# Patient Record
Sex: Female | Born: 1990
Health system: Southern US, Community
[De-identification: ages and names within clinical notes are randomized; demographics above are authoritative.]

## PROBLEM LIST (undated history)

## (undated) DIAGNOSIS — F32A Depression, unspecified: Secondary | ICD-10-CM

## (undated) DIAGNOSIS — F329 Major depressive disorder, single episode, unspecified: Secondary | ICD-10-CM

## (undated) DIAGNOSIS — F419 Anxiety disorder, unspecified: Secondary | ICD-10-CM

## (undated) DIAGNOSIS — E559 Vitamin D deficiency, unspecified: Secondary | ICD-10-CM

## (undated) DIAGNOSIS — R634 Abnormal weight loss: Secondary | ICD-10-CM

## (undated) DIAGNOSIS — F988 Other specified behavioral and emotional disorders with onset usually occurring in childhood and adolescence: Secondary | ICD-10-CM

## (undated) HISTORY — PX: TONSILLECTOMY AND ADENOIDECTOMY: SUR1326

## (undated) HISTORY — DX: Abnormal weight loss: R63.4

## (undated) HISTORY — DX: Anxiety disorder, unspecified: F41.9

## (undated) HISTORY — DX: Depression, unspecified: F32.A

## (undated) HISTORY — DX: Vitamin D deficiency, unspecified: E55.9

## (undated) HISTORY — DX: Other specified behavioral and emotional disorders with onset usually occurring in childhood and adolescence: F98.8

## (undated) HISTORY — DX: Major depressive disorder, single episode, unspecified: F32.9

---
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
## 2011-04-01 IMAGING — US US BIOPSY
1 series · 13 of 22 positions shown · non-contrast
Comparison: none

CLINICAL DATA: Palpable right neck mass

[Series 1: us biopsy · 0.06mm/px · 13 of 22 slices shown]
[im 1/22]
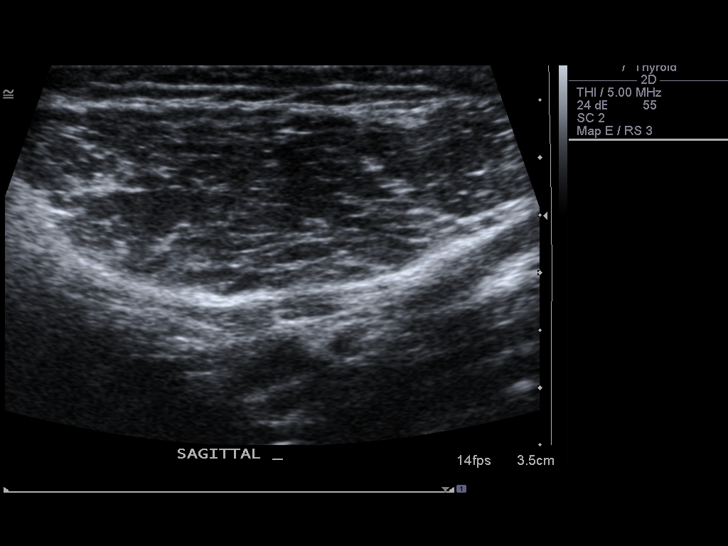
[im 3/22]
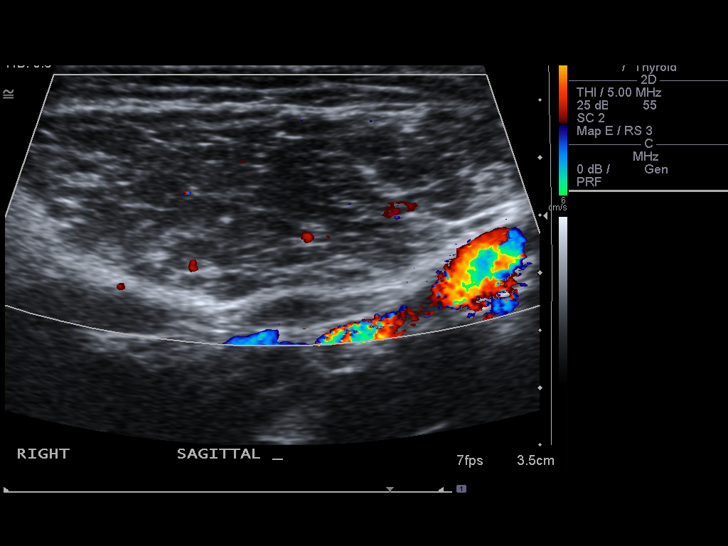
[im 5/22]
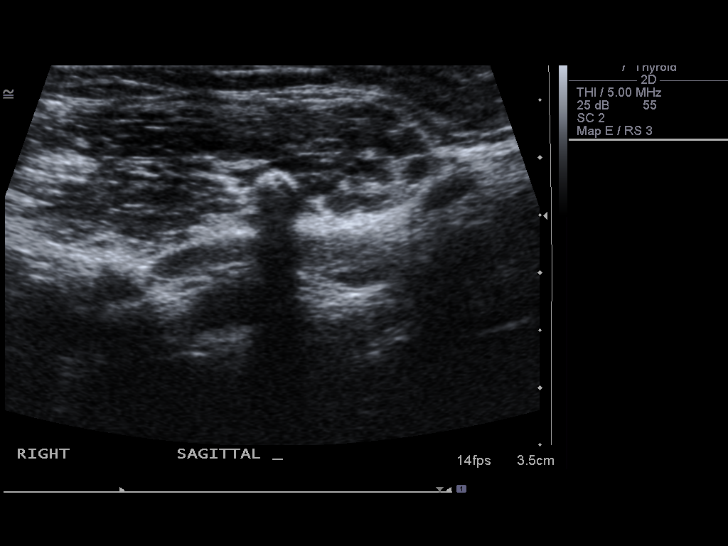
[im 6/22]
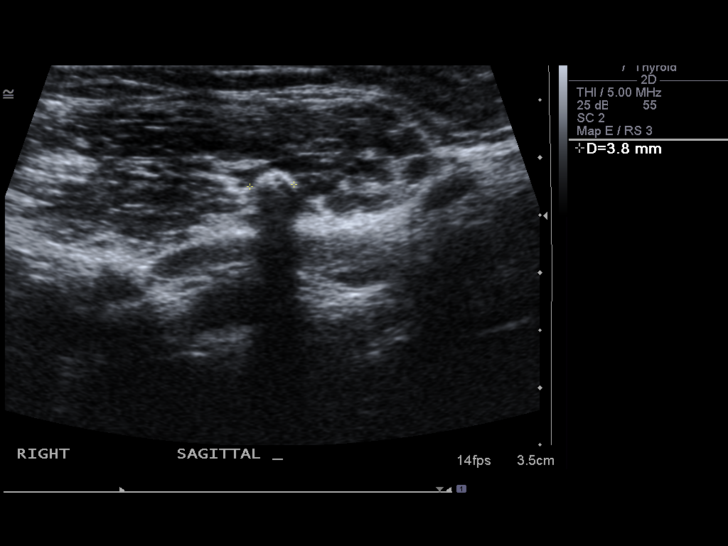
[im 8/22]
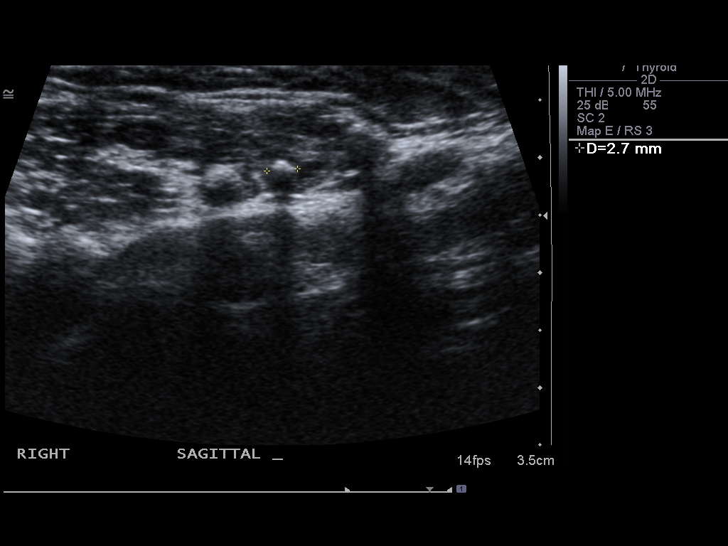
[im 10/22]
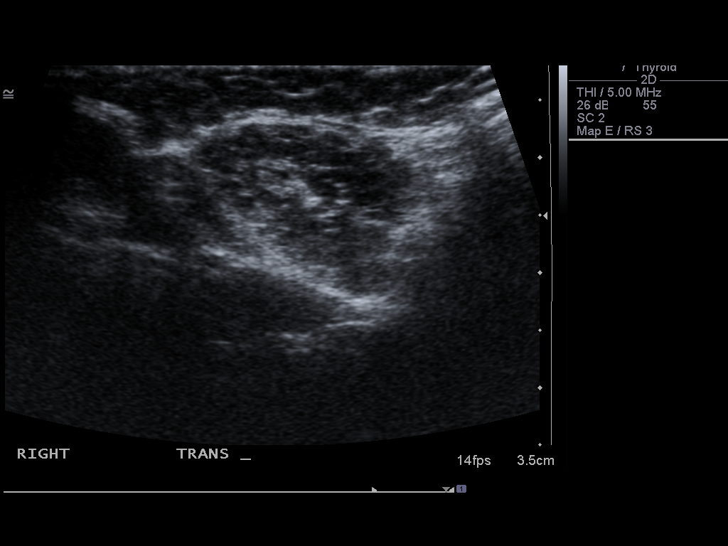
[im 12/22]
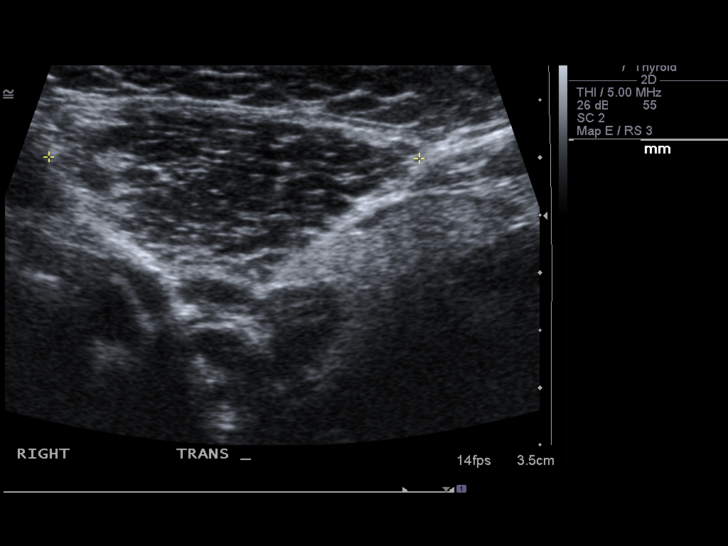
[im 13/22]
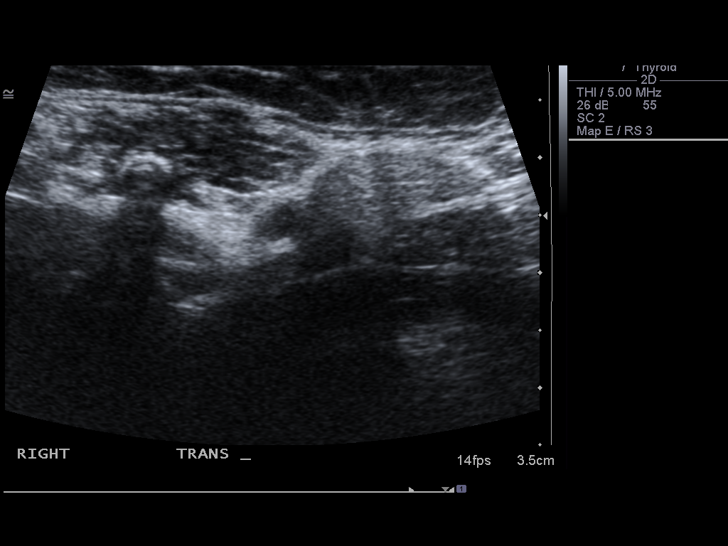
[im 15/22]
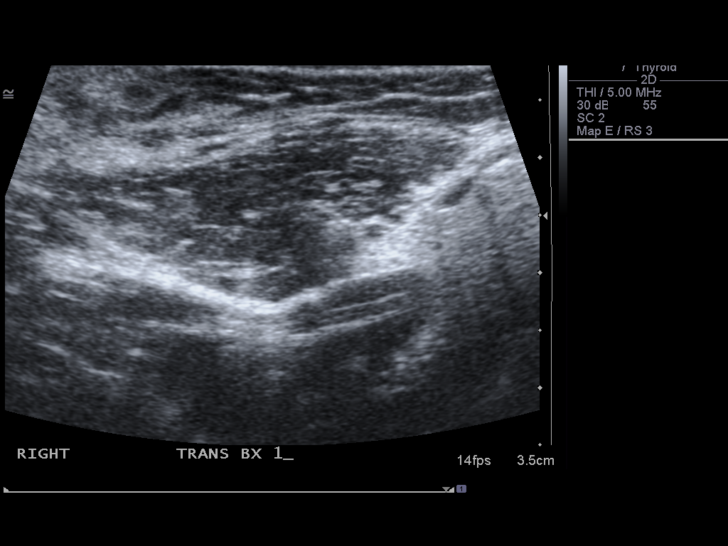
[im 17/22]
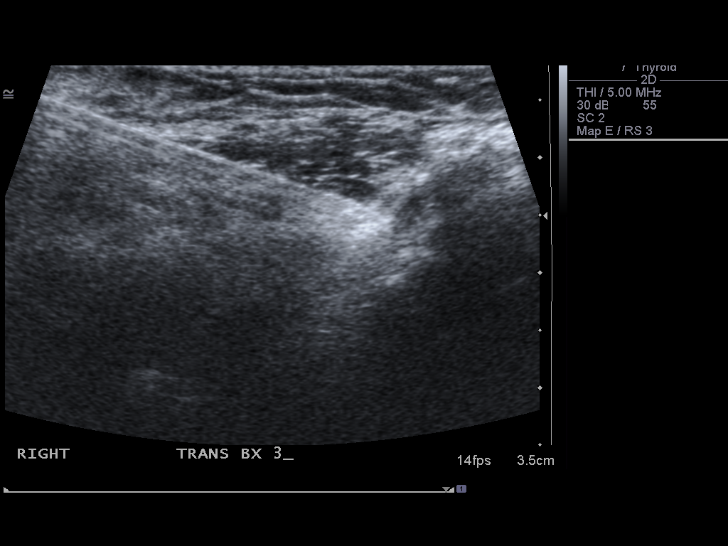
[im 18/22]
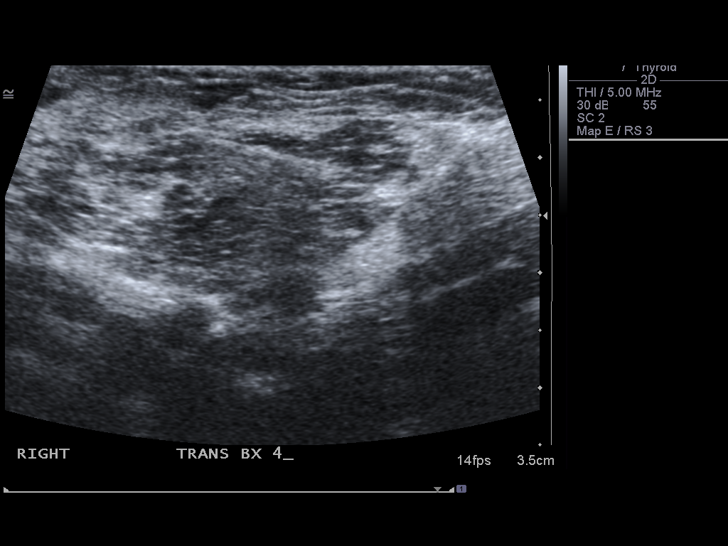
[im 20/22]
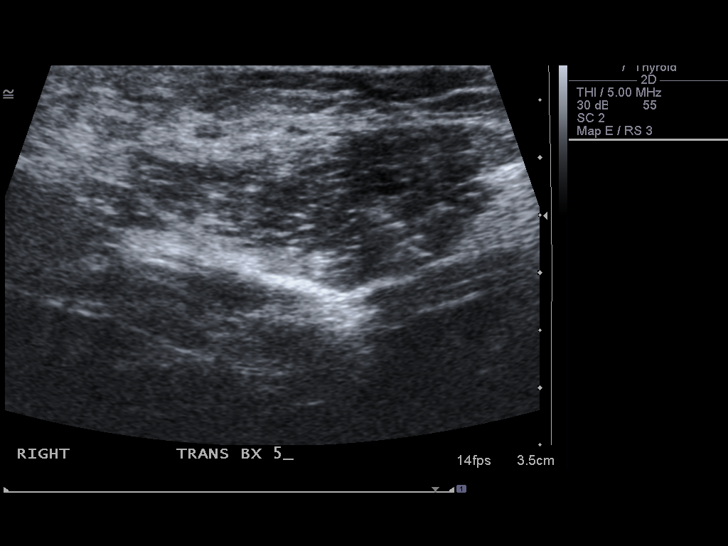
[im 22/22]
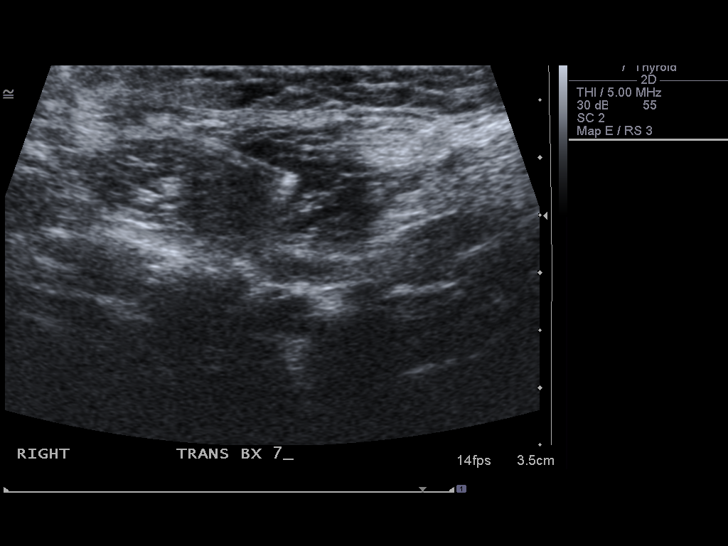

[13 of 22 positions shown; findings below may reference images not displayed]

ULTRASOUND RIGHT NECK MASS FNA AND CORE BIOPSIES

Date:  07/21/2010 [DATE]

Radiologist:  Alia Tiger, M.D.

Medications:  1% lidocaine locally

Guidance:  Ultrasound

Complications:  No immediate

PROCEDURE/FINDINGS:

Informed consent was obtained from the patient following
explanation of the procedure, risks, benefits and alternatives.
The patient understands, agrees and consents for the procedure.
All questions were addressed.  A time out was performed.

Maximal barrier sterile technique utilized including caps, mask,
sterile gowns, sterile gloves, large sterile drape, hand hygiene,
and Chloroprep

Previous CT scan of the neck was reviewed demonstrating a right
neck mass inferior to or originating from the right parotid gland.
Preliminary ultrasound performed of this region.  The lesion is
palpable and located just below the right mandibular angle.  Under
sterile conditions and local anesthesia, initially an 18 gauge core
biopsy needle was advanced into the lesion.  Needle position was
confirmed with ultrasound.  Three core biopsies were attempted
however only small fragments of tissue were obtained.  Additional
attempts were made at core biopsy with a 16 gauge core needle
device also yielding only small fragments of tissue.  No intact
core sample could be obtained.  For additional sampling, FNA biopsy
was performed with a 20 gauge Tiger needle twice.  This also
yielded predominately bloody material.  After these multiple
attempts, additional biopsies were not performed.  Needles removed.
Hemostasis obtained with compression.  No immediate complication.
The patient tolerated the biopsies well.
IMPRESSION: Ultrasound right inferior parotid lesion FNA and core biopsies.

## 2011-08-31 ENCOUNTER — Other Ambulatory Visit: Payer: Self-pay | Admitting: Gastroenterology

## 2011-08-31 DIAGNOSIS — K5090 Crohn's disease, unspecified, without complications: Secondary | ICD-10-CM

## 2011-09-09 ENCOUNTER — Ambulatory Visit
Admission: RE | Admit: 2011-09-09 | Discharge: 2011-09-09 | Disposition: A | Discharge status: Home / Self Care | Payer: Medicaid Other | Source: Ambulatory Visit | Attending: Gastroenterology | Admitting: Gastroenterology

## 2011-09-09 DIAGNOSIS — K5090 Crohn's disease, unspecified, without complications: Secondary | ICD-10-CM

## 2015-12-03 DIAGNOSIS — F900 Attention-deficit hyperactivity disorder, predominantly inattentive type: Secondary | ICD-10-CM | POA: Diagnosis not present

## 2015-12-03 DIAGNOSIS — Z6834 Body mass index (BMI) 34.0-34.9, adult: Secondary | ICD-10-CM | POA: Diagnosis not present

## 2015-12-03 DIAGNOSIS — R5383 Other fatigue: Secondary | ICD-10-CM | POA: Diagnosis not present

## 2016-01-01 DIAGNOSIS — B373 Candidiasis of vulva and vagina: Secondary | ICD-10-CM | POA: Diagnosis not present

## 2016-01-06 DIAGNOSIS — F900 Attention-deficit hyperactivity disorder, predominantly inattentive type: Secondary | ICD-10-CM | POA: Diagnosis not present

## 2016-01-27 DIAGNOSIS — J029 Acute pharyngitis, unspecified: Secondary | ICD-10-CM | POA: Diagnosis not present

## 2016-04-12 DIAGNOSIS — K921 Melena: Secondary | ICD-10-CM | POA: Diagnosis not present

## 2016-04-12 DIAGNOSIS — R634 Abnormal weight loss: Secondary | ICD-10-CM | POA: Diagnosis not present

## 2016-04-12 DIAGNOSIS — F900 Attention-deficit hyperactivity disorder, predominantly inattentive type: Secondary | ICD-10-CM | POA: Diagnosis not present

## 2016-04-12 DIAGNOSIS — E559 Vitamin D deficiency, unspecified: Secondary | ICD-10-CM | POA: Diagnosis not present

## 2016-05-12 ENCOUNTER — Ambulatory Visit (INDEPENDENT_AMBULATORY_CARE_PROVIDER_SITE_OTHER): Payer: 59 | Admitting: Gastroenterology

## 2016-05-12 VITALS — BP 108/80 | HR 92 | Ht 63.75 in | Wt 186.0 lb

## 2016-05-12 DIAGNOSIS — R1032 Left lower quadrant pain: Secondary | ICD-10-CM

## 2016-05-12 DIAGNOSIS — K625 Hemorrhage of anus and rectum: Secondary | ICD-10-CM

## 2016-05-12 DIAGNOSIS — R197 Diarrhea, unspecified: Secondary | ICD-10-CM | POA: Diagnosis not present

## 2016-05-12 NOTE — Progress Notes (Signed)
[facility] Consult Note:  History: Julia Chapman 05/12/2016  Referring physician: Emeterio ReeveWOLTERS,Julia A, MD  Reason for consult/chief complaint: Abdominal Pain; fecal frequency; Rectal Bleeding; and Weight Loss   Subjective HPI:  [name] was referred to me for abdominal pain diarrhea and rectal bleeding. We have limited records from her evaluation with [facility] Madilyn [facility], at which time she was seen for some upper GI symptoms that seem to resolve after a normal UGI S and a trial of some medicines. At this point she is describing 6-9 months of some frequent bandlike lower abdominal pain and frequent rectal bleeding. She is tended toward diarrhea for many years, and says she was evaluated for in high school. She has never had a colonoscopy. Bleeding occurs with most bowel movements and probably began after the birth of her first child about a year ago. She denies antibiotic use or skin rash. Her recent PCP visit he was noted she had lost about 15 pounds in the last 6 months.  ROS:  Review of Systems  Constitutional: Negative for appetite change and unexpected weight change.  HENT: Negative for mouth sores and voice change.   Eyes: Negative for pain and redness.  Respiratory: Negative for cough and shortness of breath.   Cardiovascular: Negative for chest pain and palpitations.  Genitourinary: Negative for dysuria and hematuria.  Musculoskeletal: Negative for myalgias and arthralgias.  Skin: Negative for pallor and rash.  Neurological: Negative for weakness and headaches.  Hematological: Negative for adenopathy.    Positive for anxiety  Past Medical History: Past Medical History  Diagnosis Date  . ADD (attention deficit disorder)   . Depression   . Anxiety   . Weight loss   . Vitamin D deficiency      Past Surgical History: Past Surgical History  Procedure Laterality Date  . Tonsillectomy and adenoidectomy      with collection of tissue removed      Family History: Family History  Problem Relation Age of Onset  . Diabetes Paternal Grandfather     both grandparents of both sides  . Alzheimer's disease Paternal Grandfather   . Breast cancer Maternal Aunt   . Cervical cancer Maternal Aunt   . Ovarian cancer Maternal Grandmother   . Heart attack Maternal Grandfather   . Alcoholism Father   . Alcoholism Maternal Grandfather   . Alcoholism Maternal Uncle     x 2    Social History: Social History   Social History  . Marital Status: Single    Spouse Name: N/A  . Number of Children: 1  . Years of Education: N/A   Occupational History  . phelbotomist [employer]   Social History Main Topics  . Smoking status: Former Smoker -- 0.50 packs/day    Types: Cigarettes    Quit date: 11/29/2013  . Smokeless tobacco: Never Used  . Alcohol Use: 0.0 oz/week    0 Standard drinks or equivalent per week     Comment: glass of red wine on night off  . Drug Use: Yes     Comment: marijuana  . Sexual Activity: Not Asked   Other Topics Concern  . None   Social History Narrative  . None    Allergies: No Known Allergies  Outpatient Meds: Current Outpatient Prescriptions  Medication Sig Dispense Refill  . buPROPion (WELLBUTRIN SR) 200 MG 12 hr tablet Take 1 tablet by mouth 2 (two) times daily.    Marland Kitchen. lisdexamfetamine (VYVANSE) 20 MG capsule  Take 1 capsule by mouth as needed.     No current facility-administered medications for this visit.      ___________________________________________________________________ Objective  Exam:  BP 108/80 mmHg  Pulse 92  Ht 5' 3.75" (1.619 m)  Wt 186 lb (84.369 kg)  BMI 32.19 kg/m2  LMP 05/07/2016   General: this is a(n) Well-appearing young woman, no muscle wasting   Eyes: sclera anicteric, no redness  ENT: oral mucosa moist without lesions, no cervical or supraclavicular lymphadenopathy, good dentition  CV: RRR without murmur, S1/S2, no JVD, no peripheral  edema  Resp: clear to auscultation bilaterally, normal RR and effort noted  GI: soft, mild LLQ tenderness, with active bowel sounds. No guarding or palpable organomegaly noted.  Skin; warm and dry, no rash or jaundice noted  Neuro: awake, alert and oriented x 3. Normal gross motor function and fluent speech  Labs:  Recent normal CBC and CMP *  Assessment: Encounter Diagnoses  Name Primary?  . Diarrhea, unspecified type Yes  . Rectal bleeding   . 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  Plan:  Colonoscopy  The benefits and risks of the planned procedure were described in detail with the patient or (when appropriate) their health care proxy.  Risks were outlined as including, but not limited to, bleeding, infection, perforation, adverse medication reaction leading to cardiac or pulmonary decompensation, or pancreatitis (if ERCP).  The limitation of incomplete mucosal visualization was also discussed.  No guarantees or warranties were given.    Thank you for the courtesy of this consult.  Please call me with any questions or concerns.  Julia Chapman  CC: Julia Reeve, MD

## 2016-05-12 NOTE — Patient Instructions (Signed)
If you are age 24 or older, your body mass index should be between 23-30. Your Body mass index is 32.19 kg/(m^2). If this is out of the aforementioned range listed, please consider follow up with your Primary Care Provider.  If you are age 24 or younger, your body mass index should be between 19-25. Your Body mass index is 32.19 kg/(m^2). If this is out of the aformentioned range listed, please consider follow up with your Primary Care Provider.   You have been scheduled for a colonoscopy. Please follow written instructions given to you at your visit today.  Please pick up your prep supplies at the pharmacy within the next 1-3 days. If you use inhalers (even only as needed), please bring them with you on the day of your procedure. Your physician has requested that you go to www.startemmi.com and enter the access code given to you at your visit today. This web site gives a general overview about your procedure. However, you should still follow specific instructions given to you by our office regarding your preparation for the procedure.  Thank you for choosing [facility]  Dr Amada JupiterHenry Danis III

## 2016-05-25 DIAGNOSIS — Z113 Encounter for screening for infections with a predominantly sexual mode of transmission: Secondary | ICD-10-CM | POA: Diagnosis not present

## 2016-05-25 DIAGNOSIS — N898 Other specified noninflammatory disorders of vagina: Secondary | ICD-10-CM | POA: Diagnosis not present

## 2016-05-25 DIAGNOSIS — N920 Excessive and frequent menstruation with regular cycle: Secondary | ICD-10-CM | POA: Diagnosis not present

## 2016-05-25 DIAGNOSIS — Z124 Encounter for screening for malignant neoplasm of cervix: Secondary | ICD-10-CM | POA: Diagnosis not present

## 2016-05-25 DIAGNOSIS — Z01419 Encounter for gynecological examination (general) (routine) without abnormal findings: Secondary | ICD-10-CM | POA: Diagnosis not present

## 2016-06-03 ENCOUNTER — Encounter: Payer: Self-pay | Admitting: Gastroenterology

## 2016-06-03 ENCOUNTER — Ambulatory Visit (AMBULATORY_SURGERY_CENTER): Payer: 59 | Admitting: Gastroenterology

## 2016-06-03 VITALS — BP 118/77 | HR 69 | Temp 96.8°F | Resp 13 | Ht 63.0 in | Wt 186.0 lb

## 2016-06-03 DIAGNOSIS — F329 Major depressive disorder, single episode, unspecified: Secondary | ICD-10-CM | POA: Diagnosis not present

## 2016-06-03 DIAGNOSIS — K921 Melena: Secondary | ICD-10-CM | POA: Diagnosis not present

## 2016-06-03 DIAGNOSIS — R197 Diarrhea, unspecified: Secondary | ICD-10-CM | POA: Diagnosis present

## 2016-06-03 DIAGNOSIS — K625 Hemorrhage of anus and rectum: Secondary | ICD-10-CM | POA: Diagnosis not present

## 2016-06-03 MED ORDER — SODIUM CHLORIDE 0.9 % IV SOLN: 500.0000 mL | INTRAVENOUS | Status: DC

## 2016-06-03 MED ORDER — HYOSCYAMINE SULFATE 0.125 MG SL SUBL: 0.1250 mg | SUBLINGUAL_TABLET | SUBLINGUAL | Status: AC | PRN

## 2016-06-03 NOTE — Op Note (Signed)
[facility] Patient Name: Julia Chapman Procedure Date: 06/03/2016 9:19 AM MRN: [id number] Endoscopist: Sherilyn CooterHenry L. Myrtie Chapman , MD Age: [age] Referring MD:  Date of Birth: 1991-08-15 Gender: Female Account #: [id number] Procedure:                Colonoscopy Indications:              Lower abdominal pain, Chronic diarrhea, Rectal                            bleeding Medicines:                Monitored Anesthesia Care Procedure:                Pre-Anesthesia Assessment:                           - Prior to the procedure, a History and Physical                            was performed, and patient medications and                            allergies were reviewed. The patient's tolerance of                            previous anesthesia was also reviewed. The risks                            and benefits of the procedure and the sedation                            options and risks were discussed with the patient.                            All questions were answered, and informed consent                            was obtained. Prior Anticoagulants: The patient has                            taken no previous anticoagulant or antiplatelet                            agents. ASA Grade Assessment: II - A patient with                            mild systemic disease. After reviewing the risks                            and benefits, the patient was deemed in                            satisfactory condition to undergo the procedure.  After obtaining informed consent, the colonoscope                            was passed under direct vision. Throughout the                            procedure, the patient's blood pressure, pulse, and                            oxygen saturations were monitored continuously. The                            Model CF-HQ190L [id number]) scope was introduced                            through the anus and advanced to the the terminal                             ileum. The colonoscopy was performed without                            difficulty. The patient tolerated the procedure                            well. The quality of the bowel preparation was                            excellent. The terminal ileum, ileocecal valve,                            appendiceal orifice, and rectum were photographed.                            The bowel preparation used was SUPREP. Scope In: 9:29:53 AM Scope Out: 9:39:22 AM Scope Withdrawal Time: 0 hours 6 minutes 59 seconds  Total Procedure Duration: 0 hours 9 minutes 29 seconds  Findings:                 The perianal and digital rectal examinations were                            normal.                           The terminal ileum appeared normal.                           The entire examined colon appeared normal on direct                            and retroflexion views. Complications:            No immediate complications. Estimated Blood Loss:     Estimated blood loss: none. Impression:               - The examined portion of the ileum  was normal.                           - The entire examined colon is normal on direct and                            retroflexion views.                           - No specimens collected.                           - The overall clinical scenario is most consistent                            with IBS and benign anal bleeding. Recommendation:           - Patient has a contact number available for                            emergencies. The signs and symptoms of potential                            delayed complications were discussed with the                            patient. Return to normal activities tomorrow.                            Written discharge instructions were provided to the                            patient.                           - Resume previous diet.                           - Continue present medications.                            - No recommendation at this time regarding repeat                            colonoscopy due to age.                           - Use Levsin, NuLev (hyoscyamine) 0.125 mg 1-2 tabs                            SL q 4 hours. Julia Mcgillivray L. Myrtie Neitheranis, MD 06/03/2016 9:45:01 AM This report has been signed electronically.

## 2016-06-03 NOTE — Progress Notes (Signed)
Patient awakening,vss,report to rn 

## 2016-06-03 NOTE — Patient Instructions (Addendum)
YOU HAD AN ENDOSCOPIC PROCEDURE TODAY AT THE [facility]:   Refer to the procedure report that was given to you for any specific questions about what was found during the examination.  If the procedure report does not answer your questions, please call your gastroenterologist to clarify.  If you requested that your care partner not be given the details of your procedure findings, then the procedure report has been included in a sealed envelope for you to review at your convenience later.  YOU SHOULD EXPECT: Some feelings of bloating in the abdomen. Passage of more gas than usual.  Walking can help get rid of the air that was put into your GI tract during the procedure and reduce the bloating. If you had a lower endoscopy (such as a colonoscopy or flexible sigmoidoscopy) you may notice spotting of blood in your stool or on the toilet paper. If you underwent a bowel prep for your procedure, you may not have a normal bowel movement for a few days.  Please Note:  You might notice some irritation and congestion in your nose or some drainage.  This is from the oxygen used during your procedure.  There is no need for concern and it should clear up in a day or so.  SYMPTOMS TO REPORT IMMEDIATELY:   Following lower endoscopy (colonoscopy or flexible sigmoidoscopy):  Excessive amounts of blood in the stool  Significant tenderness or worsening of abdominal pains  Swelling of the abdomen that is new, acute  Fever of 100F or higher   For urgent or emergent issues, a gastroenterologist can be reached at any hour by calling [phone].   DIET: Your first meal following the procedure should be a small meal and then it is ok to progress to your normal diet. Heavy or fried foods are harder to digest and may make you feel nauseous or bloated.  Likewise, meals heavy in dairy and vegetables can increase bloating.  Drink plenty of fluids but you should avoid alcoholic beverages for 24  hours.  ACTIVITY:  You should plan to take it easy for the rest of today and you should NOT DRIVE or use heavy machinery until tomorrow (because of the sedation medicines used during the test).    FOLLOW UP: Our staff will call the number listed on your records the next business day following your procedure to check on you and address any questions or concerns that you may have regarding the information given to you following your procedure. If we do not reach you, we will leave a message.  However, if you are feeling well and you are not experiencing any problems, there is no need to return our call.  We will assume that you have returned to your regular daily activities without incident.  If any biopsies were taken you will be contacted by phone or by letter within the next 1-3 weeks.  Please call us at [phone] if you have not heard about the biopsies in 3 weeks.    SIGNATURES/CONFIDENTIALITY: You and/or your care partner have signed paperwork which will be entered into your electronic medical record.  These signatures attest to the fact that that the information above on your After Visit Summary has been reviewed and is understood.  Full responsibility of the confidentiality of this discharge information lies with you and/or your care-partner.   USE LEVSIN 0.125 MG SUBLINGUAL EVERY 4 HRS AS NEEDED -THIS MEDICATION ORDER WAS SENT TO [facility] [address] BY  DR. Myrtie NeitherANIS FOR YOU TO PICK UP  CONTINUE PREVIOUS MEDICATIONS AND DIET    Food Guidelines for a sensitive stomach  Many people have difficulty digesting certain foods, causing a variety of distressing and embarrassing symptoms such as abdominal pain, bloating and gas.  These foods may need to be avoided or consumed in small amounts.  Here are some tips that might be helpful for you.  1.   Lactose intolerance is the difficulty or complete inability to digest lactose, the natural sugar in milk and anything made from milk.  This  condition is harmless, common, and can begin any time during life.  Some people can digest a modest amount of lactose while others cannot tolerate any.  Also, not all dairy products contain equal amounts of lactose.  For example, hard cheeses such as parmesan have less lactose than soft cheeses such as cheddar.  Yogurt has less lactose than milk or cheese.  Many packaged foods (even many brands of bread) have milk, so read ingredient lists carefully.  It is difficult to test for lactose intolerance, so just try avoiding lactose as much as possible for a week and see what happens with your symptoms.  If you seem to be lactose intolerant, the best plan is to avoid it (but make sure you get calcium from another source).  The next best thing is to use lactase enzyme supplements, available over the counter everywhere.  Just know that many lactose intolerant people need to take several tablets with each serving of dairy to avoid symptoms.  Lastly, a lot of restaurant food is made with milk or butter.  Many are things you might not suspect, such as mashed potatoes, rice and pasta (cooked with butter) and "grilled" items.  If you are lactose intolerant, it never hurts to ask your server what has milk or butter.  2.   Fiber is an important part of your diet, but not all fiber is well-tolerated.  Insoluble fiber such as bran is often consumed by normal gut bacteria and converted into gas.  Soluble fiber such as oats, squash, carrots and green beans are typically tolerated better.  3.   Some types of carbohydrates can be poorly digested.  Examples include: fructose (apples, cherries, pears, raisins and other dried fruits), fructans (onions, zucchini, large amounts of wheat), sorbitol/mannitol/xylitol and sucralose/Splenda (common artificial sweeteners), and raffinose (lentils, broccoli, cabbage, asparagus, brussel sprouts, many types of beans).  Do a Programmer, multimediaweb search for National City[place]ODMAP diet and you will find helpful  information. Beano, a dietary supplement, will often help with raffinose-containing foods.  As with lactase tablets, you may need several per serving.  4.   Whenever possible, avoid processed food&meats and chemical additives.  High fructose corn syrup, a common sweetener, may be difficult to digest.  Eggs and soy (comes from the soybean, and added to many foods now) are the other most common bloating/gassy foods.  - Dr. Sherlynn CarbonHenry Danis [facility]

## 2016-06-04 ENCOUNTER — Telehealth: Payer: Self-pay

## 2016-06-04 NOTE — Telephone Encounter (Signed)
Unable to leave message mailbox full.

## 2016-06-07 DIAGNOSIS — K921 Melena: Secondary | ICD-10-CM | POA: Diagnosis not present

## 2016-06-07 DIAGNOSIS — F900 Attention-deficit hyperactivity disorder, predominantly inattentive type: Secondary | ICD-10-CM | POA: Diagnosis not present

## 2016-07-15 DIAGNOSIS — E559 Vitamin D deficiency, unspecified: Secondary | ICD-10-CM | POA: Diagnosis not present

## 2016-07-22 MED FILL — BUPROPION HCL SR 200 MG TAB: 200 | 30 days supply | Qty: 60 | Fill #0

## 2016-08-09 MED FILL — VYVANSE 40 MG CAPSULE: 40 | 30 days supply | Qty: 30 | Fill #0

## 2016-09-06 MED FILL — BUPROPION HCL SR 200 MG TAB: 200 | 30 days supply | Qty: 60 | Fill #1

## 2016-09-13 MED FILL — VYVANSE 40 MG CAPSULE: 40 | 30 days supply | Qty: 30 | Fill #0

## 2016-10-18 MED FILL — VYVANSE 40 MG CAPSULE: 40 | 30 days supply | Qty: 30 | Fill #0

## 2016-10-19 MED FILL — BUPROPION HCL SR 200 MG TAB: 200 | 30 days supply | Qty: 60 | Fill #2

## 2016-11-19 MED FILL — VYVANSE 40 MG CAPSULE: 40 | 30 days supply | Qty: 30 | Fill #0

## 2016-12-02 DIAGNOSIS — F329 Major depressive disorder, single episode, unspecified: Secondary | ICD-10-CM | POA: Diagnosis not present

## 2016-12-02 DIAGNOSIS — K589 Irritable bowel syndrome without diarrhea: Secondary | ICD-10-CM | POA: Diagnosis not present

## 2016-12-02 DIAGNOSIS — F900 Attention-deficit hyperactivity disorder, predominantly inattentive type: Secondary | ICD-10-CM | POA: Diagnosis not present

## 2016-12-02 MED FILL — BUPROPION HCL SR 200 MG TAB: 200 | 90 days supply | Qty: 180 | Fill #0

## 2016-12-24 MED FILL — VYVANSE 40 MG CAPSULE: 40 | 30 days supply | Qty: 30 | Fill #0

## 2017-02-07 MED FILL — VYVANSE 40 MG CAPSULE: 40 | 30 days supply | Qty: 30 | Fill #0

## 2017-03-10 DIAGNOSIS — O26899 Other specified pregnancy related conditions, unspecified trimester: Secondary | ICD-10-CM | POA: Diagnosis not present

## 2017-03-10 DIAGNOSIS — Z8759 Personal history of other complications of pregnancy, childbirth and the puerperium: Secondary | ICD-10-CM | POA: Diagnosis not present

## 2017-03-10 DIAGNOSIS — R102 Pelvic and perineal pain: Secondary | ICD-10-CM | POA: Diagnosis not present

## 2017-03-10 DIAGNOSIS — O09291 Supervision of pregnancy with other poor reproductive or obstetric history, first trimester: Secondary | ICD-10-CM | POA: Diagnosis not present

## 2017-03-16 DIAGNOSIS — R7989 Other specified abnormal findings of blood chemistry: Secondary | ICD-10-CM | POA: Diagnosis not present

## 2017-03-16 DIAGNOSIS — Z3A08 8 weeks gestation of pregnancy: Secondary | ICD-10-CM | POA: Diagnosis not present

## 2017-03-16 DIAGNOSIS — N9489 Other specified conditions associated with female genital organs and menstrual cycle: Secondary | ICD-10-CM | POA: Diagnosis not present

## 2017-04-07 DIAGNOSIS — Z3A11 11 weeks gestation of pregnancy: Secondary | ICD-10-CM | POA: Diagnosis not present

## 2017-04-07 DIAGNOSIS — Z113 Encounter for screening for infections with a predominantly sexual mode of transmission: Secondary | ICD-10-CM | POA: Diagnosis not present

## 2017-04-07 DIAGNOSIS — Z369 Encounter for antenatal screening, unspecified: Secondary | ICD-10-CM | POA: Diagnosis not present

## 2017-04-07 DIAGNOSIS — Z3482 Encounter for supervision of other normal pregnancy, second trimester: Secondary | ICD-10-CM | POA: Diagnosis not present

## 2017-04-07 DIAGNOSIS — R7989 Other specified abnormal findings of blood chemistry: Secondary | ICD-10-CM | POA: Diagnosis not present

## 2017-04-07 DIAGNOSIS — Z3481 Encounter for supervision of other normal pregnancy, first trimester: Secondary | ICD-10-CM | POA: Diagnosis not present

## 2017-04-07 DIAGNOSIS — Z6828 Body mass index (BMI) 28.0-28.9, adult: Secondary | ICD-10-CM | POA: Diagnosis not present

## 2017-04-11 MED FILL — BD NEEDLES 22GX1.5": 22G X 1-1/2 | 70 days supply | Qty: 20 | Fill #0

## 2017-04-11 MED FILL — BD NEEDLES 22GX1.5: 22G X 1-1/2 | 70 days supply | Qty: 20 | Fill #0

## 2017-04-11 MED FILL — BD SYRINGE 3 ML: 3 ML | 70 days supply | Qty: 20 | Fill #0

## 2017-04-11 MED FILL — PROGESTERONE OIL 50 MG/ML V: 50 | 26 days supply | Qty: 30 | Fill #0

## 2017-05-09 DIAGNOSIS — Z3482 Encounter for supervision of other normal pregnancy, second trimester: Secondary | ICD-10-CM | POA: Diagnosis not present

## 2017-05-09 DIAGNOSIS — R7989 Other specified abnormal findings of blood chemistry: Secondary | ICD-10-CM | POA: Diagnosis not present

## 2017-05-09 DIAGNOSIS — Z3A16 16 weeks gestation of pregnancy: Secondary | ICD-10-CM | POA: Diagnosis not present

## 2017-05-23 MED FILL — PROGESTERONE 200 MG CAPSULE: 200 | 30 days supply | Qty: 30 | Fill #0

## 2017-05-23 MED FILL — PROGESTERONE OIL 50 MG/ML V: 50 | 26 days supply | Qty: 30 | Fill #0

## 2017-05-30 DIAGNOSIS — R7989 Other specified abnormal findings of blood chemistry: Secondary | ICD-10-CM | POA: Diagnosis not present

## 2017-05-30 DIAGNOSIS — Z3A19 19 weeks gestation of pregnancy: Secondary | ICD-10-CM | POA: Diagnosis not present

## 2017-05-30 DIAGNOSIS — Z3689 Encounter for other specified antenatal screening: Secondary | ICD-10-CM | POA: Diagnosis not present

## 2017-05-30 MED FILL — BUPROPION HCL SR 200 MG TAB: 200 | 30 days supply | Qty: 60 | Fill #0

## 2017-06-09 MED FILL — BD NEEDLES 22GX1.5": 22G X 1-1/2 | 20 days supply | Qty: 20 | Fill #0

## 2017-06-09 MED FILL — BD SYRINGE 3 ML: 3 ML | 20 days supply | Qty: 20 | Fill #0

## 2017-06-09 MED FILL — BD NEEDLES 22GX1.5: 22G X 1-1/2 | 20 days supply | Qty: 20 | Fill #0

## 2017-07-04 DIAGNOSIS — R7989 Other specified abnormal findings of blood chemistry: Secondary | ICD-10-CM | POA: Diagnosis not present

## 2017-07-04 DIAGNOSIS — Z113 Encounter for screening for infections with a predominantly sexual mode of transmission: Secondary | ICD-10-CM | POA: Diagnosis not present

## 2017-07-04 DIAGNOSIS — Z369 Encounter for antenatal screening, unspecified: Secondary | ICD-10-CM | POA: Diagnosis not present

## 2017-07-11 MED FILL — BUPROPION HCL SR 200 MG TAB: 200 | 30 days supply | Qty: 60 | Fill #1

## 2017-07-11 MED FILL — PROGESTERONE OIL 50 MG/ML V: 50 | 25 days supply | Qty: 30 | Fill #0

## 2017-08-03 DIAGNOSIS — Z23 Encounter for immunization: Secondary | ICD-10-CM | POA: Diagnosis not present

## 2017-08-03 DIAGNOSIS — Z3A28 28 weeks gestation of pregnancy: Secondary | ICD-10-CM | POA: Diagnosis not present

## 2017-08-03 DIAGNOSIS — Z3483 Encounter for supervision of other normal pregnancy, third trimester: Secondary | ICD-10-CM | POA: Diagnosis not present

## 2017-08-03 DIAGNOSIS — R7989 Other specified abnormal findings of blood chemistry: Secondary | ICD-10-CM | POA: Diagnosis not present

## 2017-08-10 MED FILL — BD SYRINGE 3 ML: 3 ML | 20 days supply | Qty: 20 | Fill #0

## 2017-08-10 MED FILL — BD NEEDLES 22GX1.5: 22G X 1-1/2 | 20 days supply | Qty: 20 | Fill #0

## 2017-08-10 MED FILL — BD NEEDLES 22GX1.5": 22G X 1-1/2 | 20 days supply | Qty: 20 | Fill #0

## 2017-08-15 MED FILL — BUPROPION HCL SR 200 MG TAB: 200 | 30 days supply | Qty: 60 | Fill #2

## 2017-09-02 DIAGNOSIS — Z3A32 32 weeks gestation of pregnancy: Secondary | ICD-10-CM | POA: Diagnosis not present

## 2017-09-02 DIAGNOSIS — R7989 Other specified abnormal findings of blood chemistry: Secondary | ICD-10-CM | POA: Diagnosis not present

## 2017-09-02 DIAGNOSIS — Z23 Encounter for immunization: Secondary | ICD-10-CM | POA: Diagnosis not present

## 2017-09-02 DIAGNOSIS — O09299 Supervision of pregnancy with other poor reproductive or obstetric history, unspecified trimester: Secondary | ICD-10-CM | POA: Diagnosis not present

## 2017-09-16 MED FILL — BUPROPION HCL SR 200 MG TAB: 200 | 30 days supply | Qty: 60 | Fill #3

## 2017-09-27 DIAGNOSIS — Z3685 Encounter for antenatal screening for Streptococcus B: Secondary | ICD-10-CM | POA: Diagnosis not present

## 2017-09-27 DIAGNOSIS — Z3483 Encounter for supervision of other normal pregnancy, third trimester: Secondary | ICD-10-CM | POA: Diagnosis not present

## 2017-09-27 DIAGNOSIS — Z113 Encounter for screening for infections with a predominantly sexual mode of transmission: Secondary | ICD-10-CM | POA: Diagnosis not present

## 2017-10-12 DIAGNOSIS — Z3A38 38 weeks gestation of pregnancy: Secondary | ICD-10-CM | POA: Diagnosis not present

## 2017-10-12 DIAGNOSIS — Z87891 Personal history of nicotine dependence: Secondary | ICD-10-CM | POA: Diagnosis not present

## 2017-10-12 DIAGNOSIS — O99824 Streptococcus B carrier state complicating childbirth: Secondary | ICD-10-CM | POA: Diagnosis not present

## 2017-10-14 MED FILL — IBUPROFEN 800 MG TAB: 800 | 30 days supply | Qty: 30 | Fill #0

## 2017-10-14 MED FILL — FERROUS SULFATE 325 MG TAB: 325 (65 FE) | 100 days supply | Qty: 100 | Fill #0

## 2017-10-19 MED FILL — BUPROPION HCL SR 200 MG TAB: 200 | 30 days supply | Qty: 60 | Fill #0

## 2017-11-21 MED FILL — BUPROPION HCL SR 200 MG TAB: 200 | 30 days supply | Qty: 60 | Fill #0

## 2017-12-22 MED FILL — BUPROPION HCL SR 200 MG TAB: 200 | 30 days supply | Qty: 60 | Fill #0 | Status: TO

## 2019-08-28 ENCOUNTER — Other Ambulatory Visit (HOSPITAL_COMMUNITY)
Admission: RE | Admit: 2019-08-28 | Discharge: 2019-08-28 | Disposition: A | Discharge status: Home / Self Care | Payer: Medicaid Other | Source: Ambulatory Visit | Attending: Family Medicine | Admitting: Family Medicine

## 2019-08-28 DIAGNOSIS — Z01411 Encounter for gynecological examination (general) (routine) with abnormal findings: Secondary | ICD-10-CM | POA: Diagnosis present

## 2019-12-29 ENCOUNTER — Encounter (HOSPITAL_COMMUNITY): Payer: Self-pay

## 2019-12-29 ENCOUNTER — Ambulatory Visit (HOSPITAL_COMMUNITY)
Admission: EM | Admit: 2019-12-29 | Discharge: 2019-12-29 | Disposition: A | Discharge status: Home / Self Care | Payer: 59

## 2019-12-29 DIAGNOSIS — S0591XA Unspecified injury of right eye and orbit, initial encounter: Secondary | ICD-10-CM

## 2019-12-29 DIAGNOSIS — H5789 Other specified disorders of eye and adnexa: Secondary | ICD-10-CM | POA: Diagnosis not present

## 2019-12-29 MED ORDER — FLUORESCEIN SODIUM 1 MG OP STRP
ORAL_STRIP | OPHTHALMIC | Status: AC
  Filled 2019-12-29: qty 1

## 2019-12-29 MED ORDER — TOBRAMYCIN 0.3 % OP SOLN: 1.0000 [drp] | OPHTHALMIC | 0 refills | Status: AC

## 2019-12-29 MED ORDER — TETRACAINE HCL 0.5 % OP SOLN
OPHTHALMIC | Status: AC
  Filled 2019-12-29: qty 4
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
## 2019-12-29 NOTE — Discharge Instructions (Signed)
Make sure you follow up with your eye doctor first thing Monday morning. If you develop worsening symptoms like vision loss, eye swelling, drainage from your eye, then please report to the ER.
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
## 2020-11-23 ENCOUNTER — Encounter (HOSPITAL_BASED_OUTPATIENT_CLINIC_OR_DEPARTMENT_OTHER): Payer: Self-pay | Admitting: Emergency Medicine

## 2020-11-23 ENCOUNTER — Emergency Department (HOSPITAL_BASED_OUTPATIENT_CLINIC_OR_DEPARTMENT_OTHER)
Admission: EM | Admit: 2020-11-23 | Discharge: 2020-11-23 | Disposition: A | Discharge status: Home / Self Care | Payer: 59 | Attending: Emergency Medicine | Admitting: Emergency Medicine

## 2020-11-23 ENCOUNTER — Other Ambulatory Visit: Payer: Self-pay

## 2020-11-23 DIAGNOSIS — Z202 Contact with and (suspected) exposure to infections with a predominantly sexual mode of transmission: Secondary | ICD-10-CM | POA: Diagnosis not present

## 2020-11-23 DIAGNOSIS — Z711 Person with feared health complaint in whom no diagnosis is made: Secondary | ICD-10-CM

## 2020-11-23 DIAGNOSIS — N898 Other specified noninflammatory disorders of vagina: Secondary | ICD-10-CM | POA: Diagnosis present

## 2020-11-23 DIAGNOSIS — Z87891 Personal history of nicotine dependence: Secondary | ICD-10-CM | POA: Diagnosis not present

## 2020-11-23 LAB — WET PREP, GENITAL
Sperm: NONE SEEN
Trich, Wet Prep: NONE SEEN
Yeast Wet Prep HPF POC: NONE SEEN

## 2020-11-23 LAB — URINALYSIS, ROUTINE W REFLEX MICROSCOPIC
Bilirubin Urine: NEGATIVE
Glucose, UA: NEGATIVE mg/dL
Hgb urine dipstick: NEGATIVE
Ketones, ur: NEGATIVE mg/dL
Leukocytes,Ua: NEGATIVE
Nitrite: NEGATIVE
Protein, ur: NEGATIVE mg/dL
Specific Gravity, Urine: 1.005 (ref 1.005–1.030)
pH: 6 (ref 5.0–8.0)

## 2020-11-23 LAB — PREGNANCY, URINE: Preg Test, Ur: NEGATIVE

## 2020-11-23 MED ORDER — DOXYCYCLINE HYCLATE 100 MG PO TABS
100.0000 mg | ORAL_TABLET | Freq: Once | ORAL | Status: AC
  Administered 2020-11-23: 100 mg via ORAL
  Filled 2020-11-23: qty 1

## 2020-11-23 MED ORDER — LIDOCAINE HCL (PF) 1 % IJ SOLN
INTRAMUSCULAR | Status: AC
  Administered 2020-11-23: 1 mL
  Filled 2020-11-23: qty 5

## 2020-11-23 MED ORDER — CEFTRIAXONE SODIUM 500 MG IJ SOLR
500.0000 mg | Freq: Once | INTRAMUSCULAR | Status: AC
  Administered 2020-11-23: 500 mg via INTRAMUSCULAR
  Filled 2020-11-23: qty 500

## 2020-11-23 MED ORDER — DOXYCYCLINE HYCLATE 100 MG PO CAPS: 100.0000 mg | ORAL_CAPSULE | Freq: Two times a day (BID) | ORAL | 0 refills | Status: AC

## 2020-11-23 NOTE — ED Triage Notes (Signed)
Reports having discharge with tingling for the last two days.

## 2020-11-23 NOTE — ED Notes (Signed)
Pt self swabbed per EDP  

## 2020-11-23 NOTE — Discharge Instructions (Addendum)
You were seen today with concerns for vaginal discharge and exposure to possible STDs.  You were tested and treated.  Abstain from sexual activity for the next 10 days.  Take antibiotics as directed

## 2020-11-23 NOTE — ED Provider Notes (Signed)
[facility] EMERGENCY DEPARTMENT Provider Note   CSN: [id number] Arrival date & time: 11/23/20  0118     History Chief Complaint  Patient presents with  . Vaginal Discharge    Julia Chapman is a 29 y.o. female.  HPI     This is a 29 year old female with a history of ADD and anxiety who presents with vaginal discharge.  Patient reports she had sex with a new partner several days ago.  It was unprotected.  She reports that she had onset of vaginal discharge that was yellow in nature.  She also reports some dysuria and blood when she wipes.  No abdominal pain.  Last menstrual period was approximately 2 weeks ago.  She is not on any birth control.  She is concerned that she may have an STD.  Past Medical History:  Diagnosis Date  . ADD (attention deficit disorder)   . Anxiety   . Depression   . Vitamin D deficiency   . Weight loss     There are no problems to display for this patient.   Past Surgical History:  Procedure Laterality Date  . TONSILLECTOMY AND ADENOIDECTOMY     with collection of tissue removed     OB History   No obstetric history on file.     Family History  Problem Relation Age of Onset  . Diabetes Paternal Grandfather        both grandparents of both sides  . Alzheimer's disease Paternal Grandfather   . Breast cancer Maternal Aunt   . Cervical cancer Maternal Aunt   . Ovarian cancer Maternal Grandmother   . Heart attack Maternal Grandfather   . Alcoholism Maternal Grandfather   . Alcoholism Father   . Alcoholism Maternal Uncle        x 2  . Colon cancer Neg Hx     Social History   Tobacco Use  . Smoking status: Former Smoker    Packs/day: 0.50    Types: Cigarettes    Quit date: 11/29/2013    Years since quitting: 6.9  . Smokeless tobacco: Never Used  Substance Use Topics  . Alcohol use: Yes    Alcohol/week: 0.0 standard drinks    Comment: glass of red wine on night off  . Drug use: Yes    Comment: marijuana, not  currently    Home Medications Prior to Admission medications   Medication Sig Start Date End Date Taking? Authorizing Provider  buPROPion (WELLBUTRIN SR) 200 MG 12 hr tablet Take 1 tablet by mouth 2 (two) times daily.    [provider]  doxycycline (VIBRAMYCIN) 100 MG capsule Take 1 capsule (100 mg total) by mouth 2 (two) times daily. 11/23/20   Ama Mcmaster, Mayer Masker, MD  hyoscyamine (LEVSIN SL) 0.125 MG SL tablet Place 1 tablet (0.125 mg total) under the tongue every 4 (four) hours as needed. For abdominal cramps or diarrhea 06/03/16   Sherrilyn Rist, MD  lisdexamfetamine (VYVANSE) 20 MG capsule Take 1 capsule by mouth as needed.    [provider]  Multiple Vitamin (MULTIVITAMIN) tablet Take by mouth.    [provider]  tobramycin (TOBREX) 0.3 % ophthalmic solution Place 1 drop into the right eye every 4 (four) hours. 12/29/19   Wallis Bamberg, PA-C    Allergies    Patient has no known allergies.  Review of Systems   Review of Systems  Constitutional: Negative for fever.  Respiratory: Negative for shortness of breath.   Cardiovascular: Negative  for chest pain.  Gastrointestinal: Negative for abdominal pain.  Genitourinary: Positive for dysuria, hematuria and vaginal discharge.  All other systems reviewed and are negative.   Physical Exam Updated Vital Signs BP 130/86 (BP Location: Right Arm)   Pulse (!) 120   Temp 97.7 F (36.5 C) (Oral)   Resp 18   Ht 1.613 m (5' 3.5")   Wt 79.4 kg   SpO2 99%   BMI 30.51 kg/m   Physical Exam Vitals and nursing note reviewed.  Constitutional:      Appearance: She is well-developed and well-nourished.  HENT:     Head: Normocephalic and atraumatic.     Mouth/Throat:     Mouth: Mucous membranes are moist.  Eyes:     Pupils: Pupils are equal, round, and reactive to light.  Cardiovascular:     Rate and Rhythm: Normal rate and regular rhythm.  Pulmonary:     Effort: Pulmonary effort is normal. No respiratory  distress.  Abdominal:     Palpations: Abdomen is soft.     Tenderness: There is no abdominal tenderness.  Genitourinary:    Comments: Defer Musculoskeletal:     Cervical back: Neck supple.     Right lower leg: No edema.     Left lower leg: No edema.  Skin:    General: Skin is warm and dry.  Neurological:     Mental Status: She is alert and oriented to person, place, and time.  Psychiatric:        Mood and Affect: Mood and affect and mood normal.     ED Results / Procedures / Treatments   Labs (all labs ordered are listed, but only abnormal results are displayed) Labs Reviewed  WET PREP, GENITAL - Abnormal; Notable for the following components:      Result Value   Clue Cells Wet Prep HPF POC PRESENT (*)    WBC, Wet Prep HPF POC FEW (*)    All other components within normal limits  URINALYSIS, ROUTINE W REFLEX MICROSCOPIC  PREGNANCY, URINE  GC/CHLAMYDIA PROBE AMP ([facility]) NOT AT [facility]    EKG None  Radiology No results found.  Procedures Procedures (including critical care time)  Medications Ordered in ED Medications  cefTRIAXone (ROCEPHIN) injection 500 mg (500 mg Intramuscular Given 11/23/20 0200)  doxycycline (VIBRA-TABS) tablet 100 mg (100 mg Oral Given 11/23/20 0200)  lidocaine (PF) (XYLOCAINE) 1 % injection (1 mL  Given 11/23/20 0200)    ED Course  I have reviewed the triage vital signs and the nursing notes.  Pertinent labs & imaging results that were available during my care of the patient were reviewed by me and considered in my medical decision making (see chart for details).    MDM Rules/Calculators/A&P                          Patient presents with vaginal discharge.  She had a new sexual contact that was unprotected.  She is overall nontoxic and vital signs are reassuring.  Discussed pelvic exam versus self swab.  Patient elected to self swab.  She is not having any abdominal pain to suggest PID or other intra-abdominal process and her abdominal  exam is benign.  Wet prep shows some clue cells but otherwise is benign.  Urinalysis shows no evidence of acute UTI.  Patient was tested and treated for gonorrhea and chlamydia.  She was advised of safe sex practices.  Recommended to abstain from sexual activity  for the next 10 days.  Will be discharged with doxycycline and was given a dose of Rocephin  After history, exam, and medical workup I feel the patient has been appropriately medically screened and is safe for discharge home. Pertinent diagnoses were discussed with the patient. Patient was given return precautions.  Final Clinical Impression(s) / ED Diagnoses Final diagnoses:  Vaginal discharge  Concern about STD in female without diagnosis    Rx / DC Orders ED Discharge Orders         Ordered    doxycycline (VIBRAMYCIN) 100 MG capsule  2 times daily        11/23/20 0258           Bolton Canupp, Mayer Masker, MD 11/23/20 0302

## 2020-11-24 LAB — GC/CHLAMYDIA PROBE AMP (~~LOC~~) NOT AT ARMC
Chlamydia: NEGATIVE
Comment: NEGATIVE
Comment: NORMAL
Neisseria Gonorrhea: NEGATIVE

## 2021-06-30 ENCOUNTER — Emergency Department (HOSPITAL_COMMUNITY)
Admission: EM | Admit: 2021-06-30 | Discharge: 2021-07-01 | Disposition: A | Discharge status: Home / Self Care | Payer: 59 | Attending: Emergency Medicine | Admitting: Emergency Medicine

## 2021-06-30 ENCOUNTER — Other Ambulatory Visit: Payer: Self-pay

## 2021-06-30 ENCOUNTER — Emergency Department (HOSPITAL_COMMUNITY): Payer: 59

## 2021-06-30 DIAGNOSIS — Z20822 Contact with and (suspected) exposure to covid-19: Secondary | ICD-10-CM | POA: Insufficient documentation

## 2021-06-30 DIAGNOSIS — R42 Dizziness and giddiness: Secondary | ICD-10-CM | POA: Insufficient documentation

## 2021-06-30 DIAGNOSIS — R519 Headache, unspecified: Secondary | ICD-10-CM | POA: Diagnosis not present

## 2021-06-30 DIAGNOSIS — R079 Chest pain, unspecified: Secondary | ICD-10-CM | POA: Diagnosis not present

## 2021-06-30 DIAGNOSIS — R072 Precordial pain: Secondary | ICD-10-CM

## 2021-06-30 DIAGNOSIS — Z87891 Personal history of nicotine dependence: Secondary | ICD-10-CM | POA: Insufficient documentation

## 2021-06-30 NOTE — ED Provider Notes (Signed)
Emergency Medicine Provider Triage Evaluation Note  Julia Chapman , a 30 y.o. female  was evaluated in triage.  Pt complains of dizziness around 5pm then developed headache.  Pt reports around 10pm she developed chest pain and palpitations.  Reports the symptoms are persistent.  Rated at a 2/10.  2 cups of coffee this morning - less than normal.   Review of Systems  Positive: Chest pain, palpitations  Negative: Syncope, dysuria  Physical Exam  BP (!) 145/100 (BP Location: Right Arm)   Pulse (!) 105   Temp 98.2 F (36.8 C) (Oral)   Resp 18   SpO2 99%  Gen:   Awake, no distress   Resp:  Normal effort  MSK:   Moves extremities without difficulty  Other:  tachycardia  Medical Decision Making  Medically screening exam initiated at 11:31 PM.  Appropriate orders placed.  Julia Chapman was informed that the remainder of the evaluation will be completed by another provider, this initial triage assessment does not replace that evaluation, and the importance of remaining in the ED until their evaluation is complete.  Chest pain, palpitations.    Julia Chapman, Julia Chapman 06/30/21 2333    Julia Sprout, MD 06/30/21 [phone]

## 2021-06-30 NOTE — ED Triage Notes (Signed)
Pt c/p dizziness that progressed into a headache and then into a burning chest pain. Denies similar episode in the past. No history of cardiac/pulmonary problems.

## 2021-07-01 ENCOUNTER — Encounter (HOSPITAL_COMMUNITY): Payer: Self-pay | Admitting: Emergency Medicine

## 2021-07-01 DIAGNOSIS — R42 Dizziness and giddiness: Secondary | ICD-10-CM | POA: Diagnosis not present

## 2021-07-01 LAB — CBC WITH DIFFERENTIAL/PLATELET
Abs Immature Granulocytes: 0.04 10*3/uL (ref 0.00–0.07)
Basophils Absolute: 0.1 10*3/uL (ref 0.0–0.1)
Basophils Relative: 1 %
Eosinophils Absolute: 0.1 10*3/uL (ref 0.0–0.5)
Eosinophils Relative: 1 %
HCT: 42.7 % (ref 36.0–46.0)
Hemoglobin: 14.2 g/dL (ref 12.0–15.0)
Immature Granulocytes: 0 %
Lymphocytes Relative: 29 %
Lymphs Abs: 3.5 10*3/uL (ref 0.7–4.0)
MCH: 31.1 pg (ref 26.0–34.0)
MCHC: 33.3 g/dL (ref 30.0–36.0)
MCV: 93.6 fL (ref 80.0–100.0)
Monocytes Absolute: 0.8 10*3/uL (ref 0.1–1.0)
Monocytes Relative: 6 %
Neutro Abs: 7.4 10*3/uL (ref 1.7–7.7)
Neutrophils Relative %: 63 %
Platelets: 395 10*3/uL (ref 150–400)
RBC: 4.56 MIL/uL (ref 3.87–5.11)
RDW: 11.8 % (ref 11.5–15.5)
WBC: 11.8 10*3/uL — ABNORMAL HIGH (ref 4.0–10.5)
nRBC: 0 % (ref 0.0–0.2)

## 2021-07-01 LAB — RESP PANEL BY RT-PCR (FLU A&B, COVID) ARPGX2
Influenza A by PCR: NEGATIVE
Influenza B by PCR: NEGATIVE
SARS Coronavirus 2 by RT PCR: NEGATIVE

## 2021-07-01 LAB — COMPREHENSIVE METABOLIC PANEL
ALT: 29 U/L (ref 0–44)
AST: 27 U/L (ref 15–41)
Albumin: 4.1 g/dL (ref 3.5–5.0)
Alkaline Phosphatase: 69 U/L (ref 38–126)
Anion gap: 10 (ref 5–15)
BUN: 12 mg/dL (ref 6–20)
CO2: 25 mmol/L (ref 22–32)
Calcium: 9.5 mg/dL (ref 8.9–10.3)
Chloride: 102 mmol/L (ref 98–111)
Creatinine, Ser: 0.72 mg/dL (ref 0.44–1.00)
GFR, Estimated: >60 mL/min (ref >60)
Glucose, Bld: 103 mg/dL — ABNORMAL HIGH (ref 70–99)
Potassium: 3.3 mmol/L — ABNORMAL LOW (ref 3.5–5.1)
Sodium: 137 mmol/L (ref 135–145)
Total Bilirubin: 0.7 mg/dL (ref 0.3–1.2)
Total Protein: 7.5 g/dL (ref 6.5–8.1)

## 2021-07-01 LAB — I-STAT BETA HCG BLOOD, ED (MC, WL, AP ONLY): I-stat hCG, quantitative: <5 m[IU]/mL (ref <5)

## 2021-07-01 LAB — TROPONIN I (HIGH SENSITIVITY)
Troponin I (High Sensitivity): 3 ng/L (ref <18)
Troponin I (High Sensitivity): 4 ng/L (ref <18)

## 2021-07-01 LAB — D-DIMER, QUANTITATIVE: D-Dimer, Quant: <0.27 ug/mL-FEU (ref 0.00–0.50)

## 2021-07-01 LAB — TSH: TSH: 2.522 u[IU]/mL (ref 0.350–4.500)

## 2021-07-01 MED ORDER — METOCLOPRAMIDE HCL 5 MG/ML IJ SOLN
10.0000 mg | Freq: Once | INTRAMUSCULAR | Status: AC
  Administered 2021-07-01: 10 mg via INTRAVENOUS
  Filled 2021-07-01: qty 2

## 2021-07-01 MED ORDER — ALUM & MAG HYDROXIDE-SIMETH 200-200-20 MG/5ML PO SUSP
30.0000 mL | Freq: Once | ORAL | Status: AC
  Administered 2021-07-01: 30 mL via ORAL
  Filled 2021-07-01: qty 30

## 2021-07-01 MED ORDER — DIPHENHYDRAMINE HCL 50 MG/ML IJ SOLN
12.5000 mg | Freq: Once | INTRAMUSCULAR | Status: AC
  Administered 2021-07-01: 12.5 mg via INTRAVENOUS
  Filled 2021-07-01: qty 1

## 2021-07-01 MED ORDER — METOCLOPRAMIDE HCL 10 MG PO TABS: 10.0000 mg | ORAL_TABLET | Freq: Three times a day (TID) | ORAL | 0 refills | Status: DC | PRN

## 2021-07-01 MED ORDER — MAGNESIUM SULFATE 2 GM/50ML IV SOLN
2.0000 g | Freq: Once | INTRAVENOUS | Status: AC
  Administered 2021-07-01: 2 g via INTRAVENOUS
  Filled 2021-07-01: qty 50

## 2021-07-01 MED ORDER — OMEPRAZOLE 20 MG PO CPDR: 20.0000 mg | DELAYED_RELEASE_CAPSULE | Freq: Every day | ORAL | 0 refills | Status: AC

## 2021-07-01 MED ORDER — METOCLOPRAMIDE HCL 10 MG PO TABS: 10.0000 mg | ORAL_TABLET | Freq: Three times a day (TID) | ORAL | 0 refills | Status: AC | PRN

## 2021-07-01 MED ORDER — SODIUM CHLORIDE 0.9 % IV BOLUS
500.0000 mL | Freq: Once | INTRAVENOUS | Status: AC
  Administered 2021-07-01: 500 mL via INTRAVENOUS

## 2021-07-01 MED ORDER — KETOROLAC TROMETHAMINE 30 MG/ML IJ SOLN
15.0000 mg | Freq: Once | INTRAMUSCULAR | Status: AC
  Administered 2021-07-01: 15 mg via INTRAVENOUS
  Filled 2021-07-01: qty 1

## 2021-07-01 NOTE — ED Provider Notes (Addendum)
[facility] Provider Note   CSN: [id number] Arrival date & time: 06/30/21  2320     History Chief Complaint  Patient presents with   Dizziness    Julia Chapman is a 30 y.o. female.  The history is provided by the patient.  Dizziness Quality:  Lightheadedness Severity:  Moderate Onset quality:  Sudden Timing:  Constant Progression:  Resolved Chronicity:  New Context: not with loss of consciousness   Relieved by:  Nothing Worsened by:  Nothing Ineffective treatments:  None tried Associated symptoms: chest pain and headaches   Associated symptoms: no diarrhea, no shortness of breath, no syncope and no vomiting   Risk factors: no hx of vertigo and no Meniere's disease   Patient with ADD who presents with onset of lightheadedness and headache at 5 pm at home. Then at approximately 10 pm patient has burning pain across the chest that is persistent.  No trauma.  No changes in intake or diet.  No travel.  No OCPs.  No fevers, no rashes on the skin.  No nausea, no vomiting, no diarrhea.      Past Medical History:  Diagnosis Date   ADD (attention deficit disorder)    Anxiety    Depression    Vitamin D deficiency    Weight loss     There are no problems to display for this patient.   Past Surgical History:  Procedure Laterality Date   TONSILLECTOMY AND ADENOIDECTOMY     with collection of tissue removed     OB History   No obstetric history on file.     Family History  Problem Relation Age of Onset   Diabetes Paternal Grandfather        both grandparents of both sides   Alzheimer's disease Paternal Grandfather    Breast cancer Maternal Aunt    Cervical cancer Maternal Aunt    Ovarian cancer Maternal Grandmother    Heart attack Maternal Grandfather    Alcoholism Maternal Grandfather    Alcoholism Father    Alcoholism Maternal Uncle        x 2   Colon cancer Neg Hx     Social History   Tobacco Use   Smoking  status: Former    Packs/day: 0.50    Types: Cigarettes    Quit date: 11/29/2013    Years since quitting: 7.5   Smokeless tobacco: Never  Substance Use Topics   Alcohol use: Yes    Alcohol/week: 0.0 standard drinks    Comment: glass of red wine on night off   Drug use: Yes    Comment: marijuana, not currently    Home Medications Prior to Admission medications   Medication Sig Start Date End Date Taking? Authorizing Provider  buPROPion (WELLBUTRIN SR) 200 MG 12 hr tablet Take 1 tablet by mouth 2 (two) times daily.    [provider]  doxycycline (VIBRAMYCIN) 100 MG capsule Take 1 capsule (100 mg total) by mouth 2 (two) times daily. 11/23/20   Horton, Mayer Maskerourtney F, MD  hyoscyamine (LEVSIN SL) 0.125 MG SL tablet Place 1 tablet (0.125 mg total) under the tongue every 4 (four) hours as needed. For abdominal cramps or diarrhea 06/03/16   Sherrilyn Ristanis, Henry L III, MD  lisdexamfetamine (VYVANSE) 20 MG capsule Take 1 capsule by mouth as needed.    [provider]  Multiple Vitamin (MULTIVITAMIN) tablet Take by mouth.    [provider]  tobramycin (TOBREX) 0.3 % ophthalmic solution Place 1  drop into the right eye every 4 (four) hours. 12/29/19   Wallis Bamberg, PA-C    Allergies    Patient has no known allergies.  Review of Systems   Review of Systems  Constitutional:  Negative for fever.  HENT:  Negative for facial swelling.   Eyes:  Negative for redness.  Respiratory:  Negative for cough and shortness of breath.   Cardiovascular:  Positive for chest pain. Negative for syncope.  Gastrointestinal:  Negative for abdominal pain, constipation, diarrhea and vomiting.  Genitourinary:  Negative for difficulty urinating.  Musculoskeletal:  Negative for neck stiffness.  Skin:  Negative for rash.  Neurological:  Positive for light-headedness and headaches. Negative for facial asymmetry.  Psychiatric/Behavioral:  Negative for agitation.   All other systems reviewed and are  negative.  Physical Exam Updated Vital Signs BP 132/82   Pulse 74   Temp 98.2 F (36.8 C) (Oral)   Resp 16   SpO2 100%   Physical Exam Vitals and nursing note reviewed.  Constitutional:      General: She is not in acute distress.    Appearance: Normal appearance.  HENT:     Head: Normocephalic and atraumatic.     Nose: Nose normal.  Eyes:     Extraocular Movements: Extraocular movements intact.     Conjunctiva/sclera: Conjunctivae normal.     Pupils: Pupils are equal, round, and reactive to light.     Comments: No proptosis, intact cognition   Cardiovascular:     Rate and Rhythm: Normal rate and regular rhythm.     Pulses: Normal pulses.     Heart sounds: Normal heart sounds.  Pulmonary:     Effort: Pulmonary effort is normal.     Breath sounds: Normal breath sounds.  Abdominal:     General: Abdomen is flat. Bowel sounds are normal.     Palpations: Abdomen is soft.     Tenderness: There is no abdominal tenderness. There is no guarding.  Musculoskeletal:        General: Normal range of motion.     Cervical back: Normal range of motion and neck supple.     Right lower leg: No edema.     Left lower leg: No edema.  Skin:    General: Skin is warm and dry.     Capillary Refill: Capillary refill takes less than 2 seconds.  Neurological:     General: No focal deficit present.     Mental Status: She is alert and oriented to person, place, and time.     Deep Tendon Reflexes: Reflexes normal.  Psychiatric:        Mood and Affect: Mood normal.        Behavior: Behavior normal.    ED Results / Procedures / Treatments   Labs (all labs ordered are listed, but only abnormal results are displayed) Results for orders placed or performed during the hospital encounter of 06/30/21  CBC with Differential/Platelet  Result Value Ref Range   WBC 11.8 (H) 4.0 - 10.5 K/uL   RBC 4.56 3.87 - 5.11 MIL/uL   Hemoglobin 14.2 12.0 - 15.0 g/dL   HCT 73.5 32.9 - 92.4 %   MCV 93.6 80.0 -  100.0 fL   MCH 31.1 26.0 - 34.0 pg   MCHC 33.3 30.0 - 36.0 g/dL   RDW 26.8 34.1 - 96.2 %   Platelets 395 150 - 400 K/uL   nRBC 0.0 0.0 - 0.2 %   Neutrophils Relative % 63 %  Neutro Abs 7.4 1.7 - 7.7 K/uL   Lymphocytes Relative 29 %   Lymphs Abs 3.5 0.7 - 4.0 K/uL   Monocytes Relative 6 %   Monocytes Absolute 0.8 0.1 - 1.0 K/uL   Eosinophils Relative 1 %   Eosinophils Absolute 0.1 0.0 - 0.5 K/uL   Basophils Relative 1 %   Basophils Absolute 0.1 0.0 - 0.1 K/uL   Immature Granulocytes 0 %   Abs Immature Granulocytes 0.04 0.00 - 0.07 K/uL  D-dimer, quantitative  Result Value Ref Range   D-Dimer, Quant <0.27 0.00 - 0.50 ug/mL-FEU  I-Stat Beta hCG blood, ED ([facility], [facility], [facility] only)  Result Value Ref Range   I-stat hCG, quantitative <5.0 <5 mIU/mL   Comment 3           DG Chest 2 View  Result Date: 07/01/2021 CLINICAL DATA:  Chest pain EXAM: CHEST - 2 VIEW COMPARISON:  None. FINDINGS: The heart size and mediastinal contours are within normal limits. Both lungs are clear. The visualized skeletal structures are unremarkable. IMPRESSION: No active cardiopulmonary disease. Electronically Signed   By: Helyn Numbers MD   On: 07/01/2021 00:05    EKG EKG Interpretation  Date/Time:  Tuesday June 30 2021 23:29:07 EDT Ventricular Rate:  102 PR Interval:  140 QRS Duration: 76 QT Interval:  328 QTC Calculation: 427 R Axis:   58 Text Interpretation: Sinus tachycardia Confirmed by Serjio Deupree ([id number]) on 07/01/2021 12:11:40 AM  Radiology DG Chest 2 View  Result Date: 07/01/2021 CLINICAL DATA:  Chest pain EXAM: CHEST - 2 VIEW COMPARISON:  None. FINDINGS: The heart size and mediastinal contours are within normal limits. Both lungs are clear. The visualized skeletal structures are unremarkable. IMPRESSION: No active cardiopulmonary disease. Electronically Signed   By: Helyn Numbers MD   On: 07/01/2021 00:05    Procedures Procedures   Medications Ordered in ED Medications  magnesium sulfate  IVPB 2 g 50 mL (2 g Intravenous New Bag/Given 07/01/21 0213)  sodium chloride 0.9 % bolus 500 mL (0 mLs Intravenous Stopped 07/01/21 0131)  ketorolac (TORADOL) 30 MG/ML injection 15 mg (15 mg Intravenous Given 07/01/21 0109)  metoCLOPramide (REGLAN) injection 10 mg (10 mg Intravenous Given 07/01/21 0212)  diphenhydrAMINE (BENADRYL) injection 12.5 mg (12.5 mg Intravenous Given 07/01/21 0212)  alum & mag hydroxide-simeth (MAALOX/MYLANTA) 200-200-20 MG/5ML suspension 30 mL (30 mLs Oral Given 07/01/21 0211)    ED Course  I have reviewed the triage vital signs and the nursing notes.  Pertinent labs & imaging results that were available during my care of the patient were reviewed by me and considered in my medical decision making (see chart for details).  245 Headache zero after migraine cocktail.    Covid is negative in the ED   Patient's neck is supple, no rashes no f/c/r.  I do not believe this is meningitis or encephalitis.  EOMI,  no proptosis, intact cognition, no OCP no travel.  I do not believe this is a cavernous sinus thrombosis.  I see no signs of ICH on exam.  Patient is pain free post medication.  No tractional component.    PERC negative, Wells 0 highly doubt PE in this low risk patient but confirmatory Dddimer was < 0.27 which excludes PE in this low risk patient.  Ruled out for MI in the ED with negative EKG and 2 negative troponins.  HEART score is 1, very low risk for MACE.  Will start PPI and refer to PMD for ongoing testing and  treatment.    Julia Chapman was evaluated in Emergency Department on 07/01/2021 for the symptoms described in the history of present illness. She was evaluated in the context of the global COVID-19 pandemic, which necessitated consideration that the patient might be at risk for infection with the SARS-CoV-2 virus that causes COVID-19. Institutional protocols and algorithms that pertain to the evaluation of patients at risk for COVID-19 are in a state of rapid change  based on information released by regulatory bodies including the CDC and federal and state organizations. These policies and algorithms were followed during the patient's care in the ED.     Final Clinical Impression(s) / ED Diagnoses Final diagnoses:  None    Return for intractable cough, coughing up blood, fevers > 100.4 unrelieved by medication, shortness of breath, intractable vomiting, chest pain, shortness of breath, weakness, numbness, changes in speech, facial asymmetry, abdominal pain, passing out, Inability to tolerate liquids or food, cough, altered mental status or any concerns. No signs of systemic illness or infection. The patient is nontoxic-appearing on exam and vital signs are within normal limits. I have reviewed the triage vital signs and the nursing notes. Pertinent labs & imaging results that were available during my care of the patient were reviewed by me and considered in my medical decision making (see chart for details). After history, exam, and medical workup I feel the patient has been appropriately medically screened and is safe for discharge home. Pertinent diagnoses were discussed with the patient. Patient was given return precautions. Rx / DC Orders ED Discharge Orders     None        Dearra Myhand, MD 07/01/21 1448

## 2022-03-17 ENCOUNTER — Other Ambulatory Visit: Payer: Self-pay | Admitting: Family Medicine

## 2022-03-17 DIAGNOSIS — N644 Mastodynia: Secondary | ICD-10-CM

## 2022-04-06 ENCOUNTER — Ambulatory Visit
Admission: RE | Admit: 2022-04-06 | Discharge: 2022-04-06 | Disposition: A | Discharge status: Home / Self Care | Payer: Medicaid Other | Source: Ambulatory Visit | Attending: Family Medicine | Admitting: Family Medicine

## 2022-04-06 DIAGNOSIS — N644 Mastodynia: Secondary | ICD-10-CM

## 2022-12-16 IMAGING — US US BREAST*L* LIMITED INC AXILLA
1 series · 5 of 5 positions shown · non-contrast
Comparison: None Available.

CLINICAL DATA: Patient describes generalized intermittent pain
within the periareolar LEFT breast, predominantly within the upper
and outer regions of the periareolar LEFT breast.

EXAM:
ULTRASOUND OF THE LEFT BREAST

[Series 1: us breast*left* limited inc axilla · 0.07mm/px · 5 of 5 slices shown]
[im 1/5]
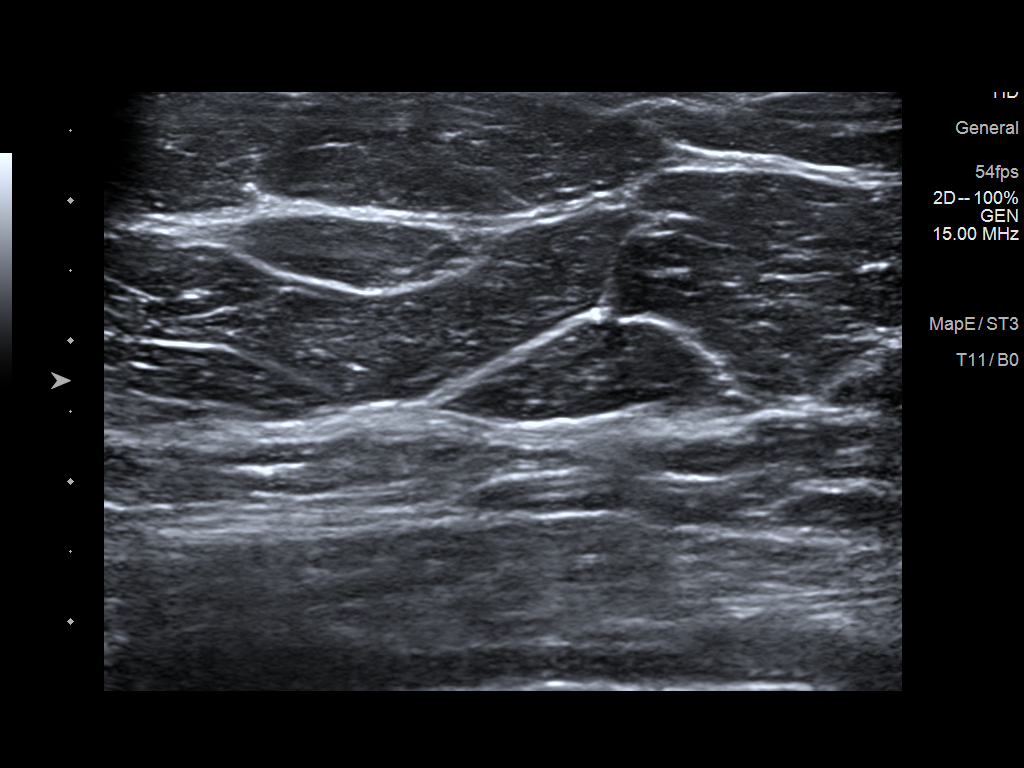
[im 2/5]
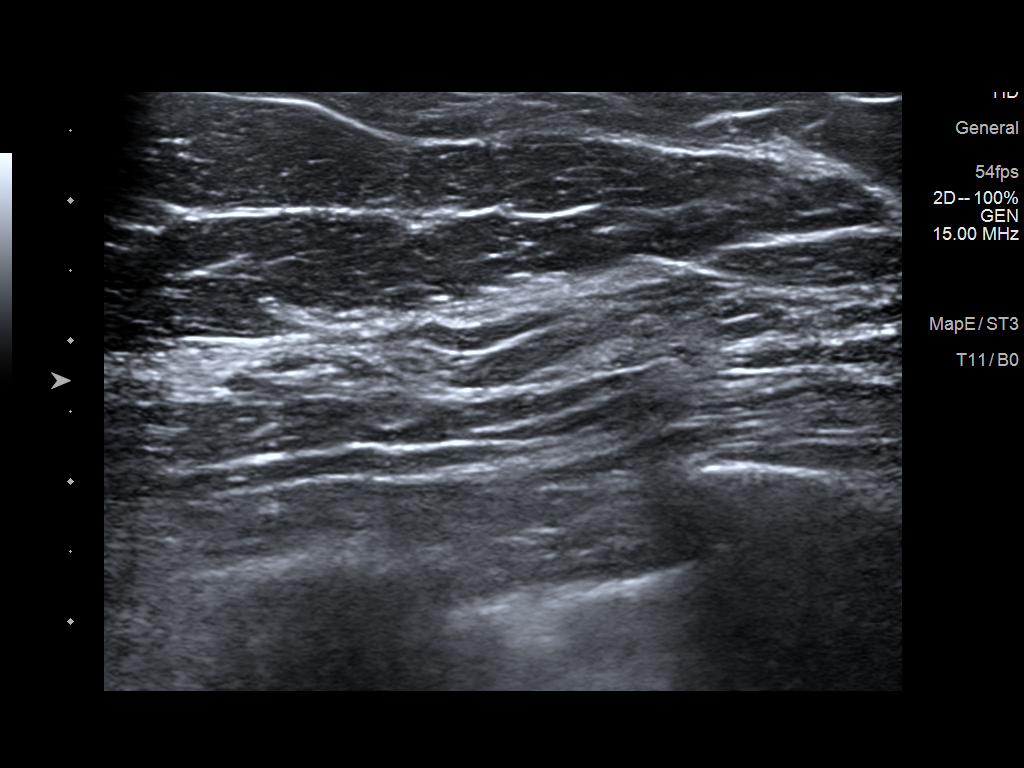
[im 3/5]
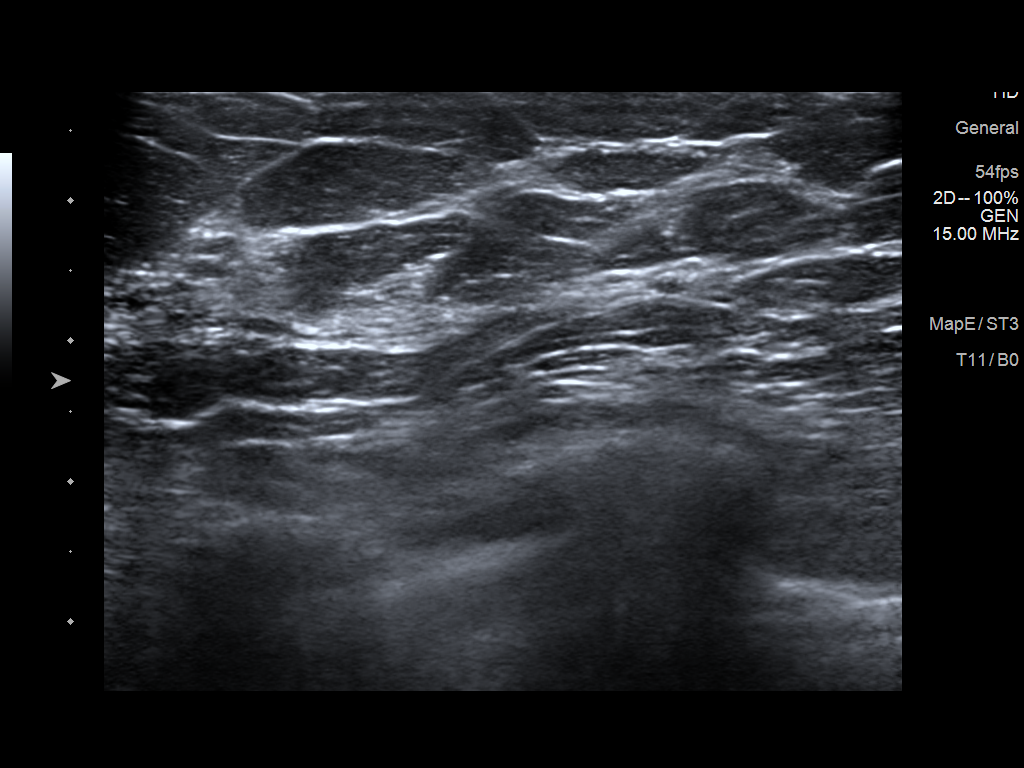
[im 4/5]
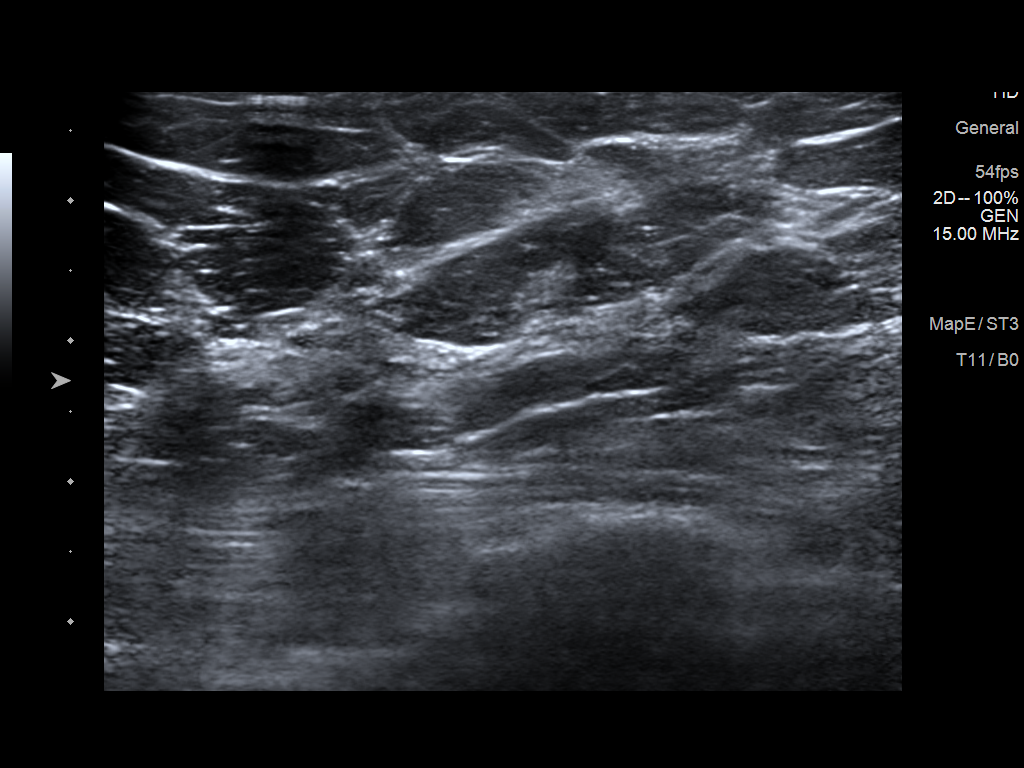
[im 5/5]
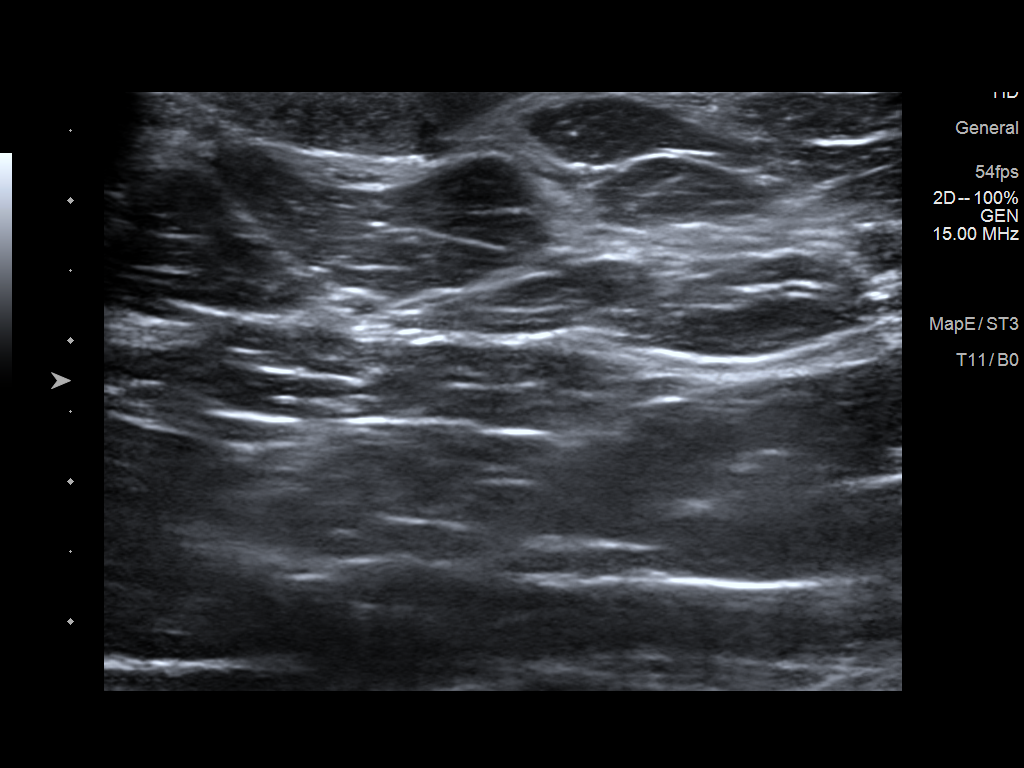

[5 of 5 positions shown; findings below may reference images not displayed]

FINDINGS: On physical exam, I feel no fixed or circumscribed mass felt within
the upper or outer portions of the periareolar LEFT breast.

Targeted ultrasound is performed, evaluating the periareolar LEFT
breast with particular attention to the 12-3 o'clock axes as
directed by the patient and the retroareolar region, showing only
normal fibroglandular tissues and fat lobules throughout. No solid
or cystic mass. No fluid collection or other evidence of infection.
IMPRESSION: No evidence of malignancy or acute findings within the LEFT breast.

RECOMMENDATION:
1. Screening mammogram at age 41 unless there are persistent or
intervening clinical concerns. (Code:W5-0-75P)
2. Breast pain is a common condition which will often resolve on its
own without intervention. Benign causes of breast pain were
discussed with the patient. Breast pain can be improved by
over-the-counter pain medications such as Aspercreme, oral NSAIDs,
and topical NSAIDs. Studies have shown an improvement with use of
evening primrose oil and Vitamin E. The patient was instructed to
return for additional imaging if a palpable lump developed.

I have discussed the findings and recommendations with the patient.
If applicable, a reminder letter will be sent to the patient
regarding the next appointment.

BI-RADS CATEGORY  1: Negative.
